# Patient Record
Sex: Male | Born: 1970 | Race: White | Hispanic: No | Marital: Married | Smoking: Never smoker
Health system: Southern US, Community
[De-identification: ages and names within clinical notes are randomized; demographics above are authoritative.]

## PROBLEM LIST (undated history)

## (undated) DIAGNOSIS — Z789 Other specified health status: Secondary | ICD-10-CM

## (undated) HISTORY — PX: NO PAST SURGERIES: SHX2092

---

## 2014-07-03 ENCOUNTER — Ambulatory Visit (HOSPITAL_COMMUNITY)
Admission: RE | Admit: 2014-07-03 | Discharge: 2014-07-03 | Disposition: A | Discharge status: Home / Self Care | Payer: BLUE CROSS/BLUE SHIELD | Source: Ambulatory Visit | Attending: Family Medicine | Admitting: Family Medicine

## 2014-07-03 ENCOUNTER — Other Ambulatory Visit: Payer: Self-pay | Admitting: Family Medicine

## 2014-07-03 ENCOUNTER — Ambulatory Visit (INDEPENDENT_AMBULATORY_CARE_PROVIDER_SITE_OTHER): Payer: BLUE CROSS/BLUE SHIELD | Admitting: Family Medicine

## 2014-07-03 VITALS — BP 124/76 | HR 64 | Temp 98.0°F | Resp 18 | Ht 75.0 in | Wt 246.0 lb

## 2014-07-03 DIAGNOSIS — R112 Nausea with vomiting, unspecified: Secondary | ICD-10-CM | POA: Insufficient documentation

## 2014-07-03 DIAGNOSIS — D72829 Elevated white blood cell count, unspecified: Secondary | ICD-10-CM

## 2014-07-03 DIAGNOSIS — I714 Abdominal aortic aneurysm, without rupture, unspecified: Secondary | ICD-10-CM

## 2014-07-03 DIAGNOSIS — K8020 Calculus of gallbladder without cholecystitis without obstruction: Secondary | ICD-10-CM | POA: Diagnosis not present

## 2014-07-03 DIAGNOSIS — R1013 Epigastric pain: Secondary | ICD-10-CM

## 2014-07-03 DIAGNOSIS — K8010 Calculus of gallbladder with chronic cholecystitis without obstruction: Secondary | ICD-10-CM | POA: Diagnosis not present

## 2014-07-03 LAB — COMPREHENSIVE METABOLIC PANEL
ALBUMIN: 4.4 g/dL (ref 3.5–5.2)
ALT: 26 U/L (ref 0–53)
AST: 19 U/L (ref 0–37)
Alkaline Phosphatase: 52 U/L (ref 39–117)
BUN: 13 mg/dL (ref 6–23)
CALCIUM: 9.4 mg/dL (ref 8.4–10.5)
CHLORIDE: 106 meq/L (ref 96–112)
CO2: 26 mEq/L (ref 19–32)
Creat: 0.7 mg/dL (ref 0.50–1.35)
Glucose, Bld: 145 mg/dL — ABNORMAL HIGH (ref 70–99)
POTASSIUM: 4.2 meq/L (ref 3.5–5.3)
Sodium: 143 mEq/L (ref 135–145)
Total Bilirubin: 0.5 mg/dL (ref 0.2–1.2)
Total Protein: 7 g/dL (ref 6.0–8.3)

## 2014-07-03 LAB — POCT CBC
Granulocyte percent: 90.3 %G — AB (ref 37–80)
HEMATOCRIT: 44.3 % (ref 43.5–53.7)
HEMOGLOBIN: 14.8 g/dL (ref 14.1–18.1)
LYMPH, POC: 0.7 (ref 0.6–3.4)
MCH, POC: 28.2 pg (ref 27–31.2)
MCHC: 33.3 g/dL (ref 31.8–35.4)
MCV: 84.6 fL (ref 80–97)
MID (cbc): 0.8 (ref 0–0.9)
MPV: 7.1 fL (ref 0–99.8)
POC Granulocyte: 13.7 — AB (ref 2–6.9)
POC LYMPH PERCENT: 4.5 %L — AB (ref 10–50)
POC MID %: 5.2 %M (ref 0–12)
Platelet Count, POC: 288 10*3/uL (ref 142–424)
RBC: 5.24 M/uL (ref 4.69–6.13)
RDW, POC: 12.9 %
WBC: 15.2 10*3/uL — AB (ref 4.6–10.2)

## 2014-07-03 LAB — AMYLASE: Amylase: 17 U/L (ref 0–105)

## 2014-07-03 LAB — LIPASE: Lipase: <10 U/L (ref 0–75)

## 2014-07-03 MED ORDER — GI COCKTAIL ~~LOC~~
30.0000 mL | Freq: Once | ORAL | Status: AC
  Administered 2014-07-03: 30 mL via ORAL

## 2014-07-03 MED ORDER — ONDANSETRON 4 MG PO TBDP
8.0000 mg | ORAL_TABLET | Freq: Once | ORAL | Status: AC
  Administered 2014-07-03: 8 mg via ORAL

## 2014-07-03 MED ORDER — ONDANSETRON 8 MG PO TBDP: 8.0000 mg | ORAL_TABLET | Freq: Three times a day (TID) | ORAL | Status: DC | PRN

## 2014-07-03 NOTE — Patient Instructions (Signed)
[facility] on the 1st Floor. **They are expecting your arrival.

## 2014-07-03 NOTE — Progress Notes (Signed)
I also examined this pt and noted epigastric TTP, no other belly tenderness.   He denies any CP with this illness or with exertion.  Agree with close follow-up with general surgery.   Will remind pt that he needs repeat US in 3 years- letter  IMPRESSION: 1. Cholelithiasis with minimal wall thickening. Absent sonographic Eulah PontMurphy sign argues against acute cholecystitis. Findings can be seen with chronic cholecystitis, however. 2. Small abdominal aortic aneurysm. Recommend followup by ultrasound in 3 years. This recommendation follows ACR consensus guidelines: White Paper of the ACR Incidental Findings Committee II on Vascular Findings. J Am Coll Radiol 2013; 10:789-794.

## 2014-07-03 NOTE — Progress Notes (Signed)
Subjective:    Patient ID: Matthew SayersJeffrey Goodman, male    DOB: 11/13/1970, 44 y.o.   MRN: [id number]  HPI This is a pleasant 44 yo male who does not have regular medical care. He presents today with constant upper abdominal ache. The pain awoke him from sleep at 3 am today. Can not get comfortable. Has vomited about 4 times. Vomitus stomach contacts and liquid. No blood. Had chills and felt hot with vomiting.  Has not taken any medication. Pain currently 9-10/10.  Had similar symptoms about 1-2 years ago x 2. These episodes resolved spontaneously in a short while. He thinks they were related to eating peanut butter which he had last night. He is concerned that this is from his gall bladder. He had one episode of brown diarrhea when he awoke this morning.    History reviewed. No pertinent past medical history. History reviewed. No pertinent past surgical history. History reviewed. No pertinent family history. History  Substance Use Topics  . Smoking status: Never Smoker   . Smokeless tobacco: Not on file  . Alcohol Use: 3.6 oz/week    6 Standard drinks or equivalent per week  Medications, allergies, past medical history, surgical history, family history, social history and problem list reviewed and updated.  Review of Systems No chest pain, no SOB, no fevers.     Objective:   Physical Exam  Constitutional: He is oriented to person, place, and time. He appears well-developed and well-nourished.  Appears moderately uncomfortable, moving and shifting in his seat.   HENT:  Head: Normocephalic and atraumatic.  Eyes: Conjunctivae are normal.  Neck: Normal range of motion. Neck supple.  Cardiovascular: Normal rate, regular rhythm and normal heart sounds.   Pulmonary/Chest: Effort normal and breath sounds normal.  Abdominal: Soft. He exhibits no distension. Bowel sounds are decreased. There is no hepatosplenomegaly. There is tenderness in the epigastric area. There is no rigidity, no rebound, no  guarding, no tenderness at McBurney's point and negative Murphy's sign. No hernia.  Musculoskeletal: Normal range of motion.  Neurological: He is alert and oriented to person, place, and time.  Skin: Skin is warm and dry.  Good skin turgor.    Psychiatric: He has a normal mood and affect. His behavior is normal. Judgment and thought content normal.  Vitals reviewed.  BP 124/76 mmHg  Pulse 64  Temp(Src) 98 F (36.7 C) (Oral)  Resp 18  Ht 6\' 3"  (1.905 m)  Wt 246 lb (111.585 kg)  BMI 30.75 kg/m2  SpO2 98% Given zofran 8 mg ODT with some relief of nausea  Given GI cocktail 30 ml at 12:05- no change in pain in 10 minutes.  Results for orders placed or performed in visit on 07/03/14  POCT CBC  Result Value Ref Range   WBC 15.2 (A) 4.6 - 10.2 K/uL   Lymph, poc 0.7 0.6 - 3.4   POC LYMPH PERCENT 4.5 (A) 10 - 50 %L   MID (cbc) 0.8 0 - 0.9   POC MID % 5.2 0 - 12 %M   POC Granulocyte 13.7 (A) 2 - 6.9   Granulocyte percent 90.3 (A) 37 - 80 %G   RBC 5.24 4.69 - 6.13 M/uL   Hemoglobin 14.8 14.1 - 18.1 g/dL   HCT, POC 40.944.3 81.143.5 - 53.7 %   MCV 84.6 80 - 97 fL   MCH, POC 28.2 27 - 31.2 pg   MCHC 33.3 31.8 - 35.4 g/dL   RDW, POC 91.412.9 %   Platelet  Count, POC 288 142 - 424 K/uL   MPV 7.1 0 - 99.8 fL   Abdominal Ultrasound IMPRESSION: 1. Cholelithiasis with minimal wall thickening. Absent sonographic Eulah PontMurphy sign argues against acute cholecystitis. Findings can be seen with chronic cholecystitis, however. 2. Small abdominal aortic aneurysm. Recommend followup by ultrasound in 3 years. This recommendation follows ACR consensus guidelines: White Paper of the ACR Incidental Findings Committee II on Vascular Findings. J Am Coll Radiol 2013; 10:789-794.   Electronically Signed  By: Leanna BattlesMelinda Blietz M.D.  On: 07/03/2014 14:34     Assessment & Plan:  1. Epigastric pain - POCT CBC - Comprehensive metabolic panel - gi cocktail (Maalox,Lidocaine,Donnatal); Take 30 mLs by mouth once. -  Amylase - Lipase - US Abdomen Complete; Future - Ambulatory referral to General Surgery  2. Non-intractable vomiting with nausea, vomiting of unspecified type - ondansetron (ZOFRAN-ODT) disintegrating tablet 8 mg; Take 2 tablets (8 mg total) by mouth once. - Amylase - Lipase - Ambulatory referral to General Surgery  3. Calculus of gallbladder with chronic cholecystitis without obstruction - Ambulatory referral to General Surgery  4. Leukocytosis - Ambulatory referral to General Surgery   Discussed with Dr. Patsy Lageropland who also examined the patient.  Called [facility] and was given an appointment for patient on 07/05/14 at 11:15. Patient notified and instructed to go to ER if worsening pain, fever over 101, inability to hold down liquids.    Olean Reeeborah Ebelin Dillehay, FNP-BC  [facility], [facility]  07/03/2014 3:37 PM

## 2014-07-05 ENCOUNTER — Other Ambulatory Visit: Payer: Self-pay | Admitting: Surgery

## 2014-08-05 NOTE — Patient Instructions (Addendum)
Matthew Goodman  08/05/2014   Your procedure is scheduled on:    08/12/2014    Report to [facility]  Entrance and follow signs to          3 EAST.  Take East elevators to 3 East at 1220pm  Call this number if you have problems the morning of surgery [phone]   Remember: ONLY 1 PERSON MAY GO WITH YOU TO SHORT STAY TO GET  READY MORNING OF YOUR SURGERY.  Do not eat foodafter midnite.  May have clear liquids until 0730am morning of surgery then nothing by mouth.     CLEAR LIQUID DIET   Foods Allowed                                                                     Foods Excluded  Coffee and tea, regular and decaf                             liquids that you cannot  Plain Jell-O in any flavor                                             see through such as: Fruit ices (not with fruit pulp)                                     milk, soups, orange juice  Iced Popsicles                                    All solid food Carbonated beverages, regular and diet                                    Cranberry, grape and apple juices Sports drinks like Gatorade Lightly seasoned clear broth or consume(fat free) Sugar, honey syrup  Sample Menu Breakfast                                Lunch                                     Supper Cranberry juice                    Beef broth                            Chicken broth Jell-O                                     Grape juice  Apple juice Coffee or tea                        Jell-O                                      Popsicle                                                Coffee or tea                        Coffee or tea  _____________________________________________________________________       Take these medicines the morning of surgery with A SIP OF WATER: none                                You may not have any metal on your body including hair pins and              piercings  Do not  wear jewelry,  lotions, powders or perfumes, deodorant                       Men may shave face and neck.   Do not bring valuables to the hospital. [facility] IS NOT             RESPONSIBLE   FOR VALUABLES.  Contacts, dentures or bridgework may not be worn into surgery.  Leave suitcase in the car. After surgery it may be brought to your room.       Special Instructions:coughing and deep breathing exercises, leg exercises               Please read over the following fact sheets you were given: _____________________________________________________________________             [facility] - Preparing for Surgery Before surgery, you can play an important role.  Because skin is not sterile, your skin needs to be as free of germs as possible.  You can reduce the number of germs on your skin by washing with CHG (chlorahexidine gluconate) soap before surgery.  CHG is an antiseptic cleaner which kills germs and bonds with the skin to continue killing germs even after washing. Please DO NOT use if you have an allergy to CHG or antibacterial soaps.  If your skin becomes reddened/irritated stop using the CHG and inform your nurse when you arrive at Short Stay. Do not shave (including legs and underarms) for at least 48 hours prior to the first CHG shower.  You may shave your face/neck. Please follow these instructions carefully:  1.  Shower with CHG Soap the night before surgery and the  morning of Surgery.  2.  If you choose to wash your hair, wash your hair first as usual with your  normal  shampoo.  3.  After you shampoo, rinse your hair and body thoroughly to remove the  shampoo.                           4.  Use CHG as you would any other liquid soap.  You can  apply chg directly  to the skin and wash                       Gently with a scrungie or clean washcloth.  5.  Apply the CHG Soap to your body ONLY FROM THE NECK DOWN.   Do not use on face/ open                           Wound or open  sores. Avoid contact with eyes, ears mouth and genitals (private parts).                       Wash face,  Genitals (private parts) with your normal soap.             6.  Wash thoroughly, paying special attention to the area where your surgery  will be performed.  7.  Thoroughly rinse your body with warm water from the neck down.  8.  DO NOT shower/wash with your normal soap after using and rinsing off  the CHG Soap.                9.  Pat yourself dry with a clean towel.            10.  Wear clean pajamas.            11.  Place clean sheets on your bed the night of your first shower and do not  sleep with pets. Day of Surgery : Do not apply any lotions/deodorants the morning of surgery.  Please wear clean clothes to the hospital/surgery center.  FAILURE TO FOLLOW THESE INSTRUCTIONS MAY RESULT IN THE CANCELLATION OF YOUR SURGERY PATIENT SIGNATURE_________________________________  NURSE SIGNATURE__________________________________  ________________________________________________________________________

## 2014-08-06 ENCOUNTER — Encounter (HOSPITAL_COMMUNITY): Payer: Self-pay

## 2014-08-06 ENCOUNTER — Encounter (HOSPITAL_COMMUNITY)
Admission: RE | Admit: 2014-08-06 | Discharge: 2014-08-06 | Disposition: A | Discharge status: Home / Self Care | Payer: BLUE CROSS/BLUE SHIELD | Source: Ambulatory Visit | Attending: Surgery | Admitting: Surgery

## 2014-08-06 DIAGNOSIS — K829 Disease of gallbladder, unspecified: Secondary | ICD-10-CM | POA: Insufficient documentation

## 2014-08-06 DIAGNOSIS — Z01812 Encounter for preprocedural laboratory examination: Secondary | ICD-10-CM | POA: Diagnosis present

## 2014-08-06 HISTORY — DX: Other specified health status: Z78.9

## 2014-08-06 LAB — CBC WITH DIFFERENTIAL/PLATELET
BASOS ABS: 0 10*3/uL (ref 0.0–0.1)
BASOS PCT: 0 % (ref 0–1)
Eosinophils Absolute: 0.4 10*3/uL (ref 0.0–0.7)
Eosinophils Relative: 5 % (ref 0–5)
HEMATOCRIT: 45.1 % (ref 39.0–52.0)
Hemoglobin: 14.9 g/dL (ref 13.0–17.0)
Lymphocytes Relative: 24 % (ref 12–46)
Lymphs Abs: 1.7 10*3/uL (ref 0.7–4.0)
MCH: 28.8 pg (ref 26.0–34.0)
MCHC: 33 g/dL (ref 30.0–36.0)
MCV: 87.1 fL (ref 78.0–100.0)
Monocytes Absolute: 0.5 10*3/uL (ref 0.1–1.0)
Monocytes Relative: 7 % (ref 3–12)
NEUTROS ABS: 4.3 10*3/uL (ref 1.7–7.7)
Neutrophils Relative %: 64 % (ref 43–77)
Platelets: 246 10*3/uL (ref 150–400)
RBC: 5.18 MIL/uL (ref 4.22–5.81)
RDW: 12.7 % (ref 11.5–15.5)
WBC: 6.9 10*3/uL (ref 4.0–10.5)

## 2014-08-06 LAB — COMPREHENSIVE METABOLIC PANEL
ALBUMIN: 4.2 g/dL (ref 3.5–5.0)
ALT: 28 U/L (ref 17–63)
AST: 19 U/L (ref 15–41)
Alkaline Phosphatase: 51 U/L (ref 38–126)
Anion gap: 8 (ref 5–15)
BILIRUBIN TOTAL: 0.9 mg/dL (ref 0.3–1.2)
BUN: 13 mg/dL (ref 6–20)
CALCIUM: 9.1 mg/dL (ref 8.9–10.3)
CO2: 29 mmol/L (ref 22–32)
CREATININE: 0.72 mg/dL (ref 0.61–1.24)
Chloride: 103 mmol/L (ref 101–111)
GFR calc Af Amer: >60 mL/min (ref >60)
GFR calc non Af Amer: >60 mL/min (ref >60)
Glucose, Bld: 91 mg/dL (ref 65–99)
Potassium: 4.1 mmol/L (ref 3.5–5.1)
SODIUM: 140 mmol/L (ref 135–145)
Total Protein: 6.9 g/dL (ref 6.5–8.1)

## 2014-08-12 ENCOUNTER — Ambulatory Visit (HOSPITAL_COMMUNITY): Payer: BLUE CROSS/BLUE SHIELD

## 2014-08-12 ENCOUNTER — Ambulatory Visit (HOSPITAL_COMMUNITY): Payer: BLUE CROSS/BLUE SHIELD | Admitting: Anesthesiology

## 2014-08-12 ENCOUNTER — Encounter (HOSPITAL_COMMUNITY): Payer: Self-pay | Admitting: *Deleted

## 2014-08-12 ENCOUNTER — Encounter (HOSPITAL_COMMUNITY)
Admission: RE | Disposition: A | Discharge status: Home / Self Care | Payer: Self-pay | Source: Ambulatory Visit | Attending: Surgery

## 2014-08-12 ENCOUNTER — Ambulatory Visit (HOSPITAL_COMMUNITY)
Admission: RE | Admit: 2014-08-12 | Discharge: 2014-08-12 | Disposition: A | Discharge status: Home / Self Care | Payer: BLUE CROSS/BLUE SHIELD | Source: Ambulatory Visit | Attending: Surgery | Admitting: Surgery

## 2014-08-12 DIAGNOSIS — I739 Peripheral vascular disease, unspecified: Secondary | ICD-10-CM | POA: Insufficient documentation

## 2014-08-12 DIAGNOSIS — I714 Abdominal aortic aneurysm, without rupture: Secondary | ICD-10-CM | POA: Diagnosis not present

## 2014-08-12 DIAGNOSIS — R1013 Epigastric pain: Secondary | ICD-10-CM | POA: Diagnosis present

## 2014-08-12 DIAGNOSIS — K8010 Calculus of gallbladder with chronic cholecystitis without obstruction: Secondary | ICD-10-CM | POA: Diagnosis not present

## 2014-08-12 DIAGNOSIS — Z419 Encounter for procedure for purposes other than remedying health state, unspecified: Secondary | ICD-10-CM

## 2014-08-12 HISTORY — PX: CHOLECYSTECTOMY: SHX55

## 2014-08-12 SURGERY — LAPAROSCOPIC CHOLECYSTECTOMY WITH INTRAOPERATIVE CHOLANGIOGRAM
Anesthesia: General | Site: Abdomen

## 2014-08-12 MED ORDER — FENTANYL CITRATE (PF) 250 MCG/5ML IJ SOLN
INTRAMUSCULAR | Status: AC
  Filled 2014-08-12: qty 5

## 2014-08-12 MED ORDER — FENTANYL CITRATE (PF) 100 MCG/2ML IJ SOLN
INTRAMUSCULAR | Status: DC | PRN
  Administered 2014-08-12 (×5): 50 ug via INTRAVENOUS

## 2014-08-12 MED ORDER — BUPIVACAINE HCL (PF) 0.25 % IJ SOLN
INTRAMUSCULAR | Status: AC
  Filled 2014-08-12: qty 30

## 2014-08-12 MED ORDER — CEFAZOLIN SODIUM-DEXTROSE 2-3 GM-% IV SOLR
INTRAVENOUS | Status: AC
  Filled 2014-08-12: qty 50

## 2014-08-12 MED ORDER — MIDAZOLAM HCL 5 MG/5ML IJ SOLN
INTRAMUSCULAR | Status: DC | PRN
  Administered 2014-08-12: 2 mg via INTRAVENOUS

## 2014-08-12 MED ORDER — HYDROCODONE-ACETAMINOPHEN 5-325 MG PO TABS: 1.0000 | ORAL_TABLET | Freq: Four times a day (QID) | ORAL | Status: AC | PRN

## 2014-08-12 MED ORDER — CHLORHEXIDINE GLUCONATE 4 % EX LIQD: 1.0000 "application " | Freq: Once | CUTANEOUS | Status: DC

## 2014-08-12 MED ORDER — HYDROMORPHONE HCL 1 MG/ML IJ SOLN
INTRAMUSCULAR | Status: AC
  Filled 2014-08-12: qty 1

## 2014-08-12 MED ORDER — ROCURONIUM BROMIDE 100 MG/10ML IV SOLN
INTRAVENOUS | Status: AC
  Filled 2014-08-12: qty 1

## 2014-08-12 MED ORDER — PROPOFOL 10 MG/ML IV BOLUS
INTRAVENOUS | Status: AC
  Filled 2014-08-12: qty 20

## 2014-08-12 MED ORDER — DEXAMETHASONE SODIUM PHOSPHATE 10 MG/ML IJ SOLN
INTRAMUSCULAR | Status: AC
  Filled 2014-08-12: qty 1

## 2014-08-12 MED ORDER — SODIUM CHLORIDE 0.9 % IJ SOLN
INTRAMUSCULAR | Status: AC
  Filled 2014-08-12: qty 10

## 2014-08-12 MED ORDER — GLYCOPYRROLATE 0.2 MG/ML IJ SOLN
INTRAMUSCULAR | Status: AC
  Filled 2014-08-12: qty 1

## 2014-08-12 MED ORDER — SCOPOLAMINE 1 MG/3DAYS TD PT72
MEDICATED_PATCH | TRANSDERMAL | Status: AC
  Filled 2014-08-12: qty 1

## 2014-08-12 MED ORDER — NEOSTIGMINE METHYLSULFATE 10 MG/10ML IV SOLN
INTRAVENOUS | Status: DC | PRN
  Administered 2014-08-12: 5 mg via INTRAVENOUS

## 2014-08-12 MED ORDER — LIDOCAINE HCL (CARDIAC) 20 MG/ML IV SOLN
INTRAVENOUS | Status: DC | PRN
  Administered 2014-08-12: 100 mg via INTRAVENOUS

## 2014-08-12 MED ORDER — IOHEXOL 300 MG/ML  SOLN
INTRAMUSCULAR | Status: DC | PRN
  Administered 2014-08-12: 5 mL

## 2014-08-12 MED ORDER — HYDROMORPHONE HCL 1 MG/ML IJ SOLN
0.2500 mg | INTRAMUSCULAR | Status: DC | PRN
  Administered 2014-08-12 (×2): 0.5 mg via INTRAVENOUS

## 2014-08-12 MED ORDER — LIDOCAINE HCL (CARDIAC) 20 MG/ML IV SOLN
INTRAVENOUS | Status: AC
  Filled 2014-08-12: qty 5

## 2014-08-12 MED ORDER — LACTATED RINGERS IV SOLN
INTRAVENOUS | Status: DC
  Administered 2014-08-12: 1000 mL via INTRAVENOUS

## 2014-08-12 MED ORDER — ONDANSETRON HCL 4 MG/2ML IJ SOLN
INTRAMUSCULAR | Status: DC | PRN
  Administered 2014-08-12: 4 mg via INTRAVENOUS

## 2014-08-12 MED ORDER — 0.9 % SODIUM CHLORIDE (POUR BTL) OPTIME
TOPICAL | Status: DC | PRN
  Administered 2014-08-12: 1000 mL

## 2014-08-12 MED ORDER — ONDANSETRON HCL 4 MG/2ML IJ SOLN
INTRAMUSCULAR | Status: AC
  Filled 2014-08-12: qty 2

## 2014-08-12 MED ORDER — GLYCOPYRROLATE 0.2 MG/ML IJ SOLN
INTRAMUSCULAR | Status: DC | PRN
  Administered 2014-08-12: 0.6 mg via INTRAVENOUS

## 2014-08-12 MED ORDER — EPHEDRINE SULFATE 50 MG/ML IJ SOLN
INTRAMUSCULAR | Status: AC
  Filled 2014-08-12: qty 1

## 2014-08-12 MED ORDER — CEFAZOLIN SODIUM-DEXTROSE 2-3 GM-% IV SOLR
2.0000 g | INTRAVENOUS | Status: AC
  Administered 2014-08-12: 2 g via INTRAVENOUS

## 2014-08-12 MED ORDER — EPHEDRINE SULFATE 50 MG/ML IJ SOLN
INTRAMUSCULAR | Status: DC | PRN
  Administered 2014-08-12 (×2): 10 mg via INTRAVENOUS

## 2014-08-12 MED ORDER — PROMETHAZINE HCL 25 MG/ML IJ SOLN: 6.2500 mg | INTRAMUSCULAR | Status: DC | PRN

## 2014-08-12 MED ORDER — HYDROCODONE-ACETAMINOPHEN 5-325 MG PO TABS: 1.0000 | ORAL_TABLET | Freq: Four times a day (QID) | ORAL | Status: DC | PRN

## 2014-08-12 MED ORDER — MIDAZOLAM HCL 2 MG/2ML IJ SOLN
INTRAMUSCULAR | Status: AC
  Filled 2014-08-12: qty 2

## 2014-08-12 MED ORDER — DEXAMETHASONE SODIUM PHOSPHATE 10 MG/ML IJ SOLN
INTRAMUSCULAR | Status: DC | PRN
Start: 2014-08-12 — End: 2014-08-12
  Administered 2014-08-12: 10 mg via INTRAVENOUS

## 2014-08-12 MED ORDER — BUPIVACAINE HCL (PF) 0.25 % IJ SOLN
INTRAMUSCULAR | Status: DC | PRN
Start: 2014-08-12 — End: 2014-08-12
  Administered 2014-08-12: 30 mL

## 2014-08-12 MED ORDER — SCOPOLAMINE 1 MG/3DAYS TD PT72
1.0000 | MEDICATED_PATCH | TRANSDERMAL | Status: DC
  Administered 2014-08-12: 1.5 mg via TRANSDERMAL

## 2014-08-12 MED ORDER — GLYCOPYRROLATE 0.2 MG/ML IJ SOLN
INTRAMUSCULAR | Status: AC
  Filled 2014-08-12: qty 3

## 2014-08-12 MED ORDER — ROCURONIUM BROMIDE 100 MG/10ML IV SOLN
INTRAVENOUS | Status: DC | PRN
  Administered 2014-08-12: 35 mg via INTRAVENOUS
  Administered 2014-08-12 (×2): 5 mg via INTRAVENOUS
  Administered 2014-08-12: 10 mg via INTRAVENOUS

## 2014-08-12 MED ORDER — LACTATED RINGERS IR SOLN
Status: DC | PRN
  Administered 2014-08-12: 1000 mL

## 2014-08-12 MED ORDER — SUCCINYLCHOLINE CHLORIDE 20 MG/ML IJ SOLN
INTRAMUSCULAR | Status: DC | PRN
  Administered 2014-08-12: 100 mg via INTRAVENOUS

## 2014-08-12 MED ORDER — PROPOFOL 10 MG/ML IV BOLUS
INTRAVENOUS | Status: DC | PRN
  Administered 2014-08-12: 40 mg via INTRAVENOUS
  Administered 2014-08-12: 200 mg via INTRAVENOUS

## 2014-08-12 SURGICAL SUPPLY — 35 items
APPLIER CLIP 5 13 M/L LIGAMAX5 (MISCELLANEOUS) ×3
APPLIER CLIP ROT 10 11.4 M/L (STAPLE) ×3
BENZOIN TINCTURE PRP APPL 2/3 (GAUZE/BANDAGES/DRESSINGS) IMPLANT
CHLORAPREP W/TINT 26ML (MISCELLANEOUS) ×3 IMPLANT
CHOLANGIOGRAM CATH TAUT (CATHETERS) ×3 IMPLANT
CLIP APPLIE 5 13 M/L LIGAMAX5 (MISCELLANEOUS) ×1 IMPLANT
CLIP APPLIE ROT 10 11.4 M/L (STAPLE) ×1 IMPLANT
CLOSURE WOUND 1/4X4 (GAUZE/BANDAGES/DRESSINGS)
COVER MAYO STAND STRL (DRAPES) ×3 IMPLANT
DECANTER SPIKE VIAL GLASS SM (MISCELLANEOUS) IMPLANT
DRAPE C-ARM 42X120 X-RAY (DRAPES) IMPLANT
DRAPE LAPAROSCOPIC ABDOMINAL (DRAPES) ×3 IMPLANT
ELECT REM PT RETURN 9FT ADLT (ELECTROSURGICAL) ×3
ELECTRODE REM PT RTRN 9FT ADLT (ELECTROSURGICAL) ×1 IMPLANT
GLOVE SURG SIGNA 7.5 PF LTX (GLOVE) ×3 IMPLANT
GOWN STRL REUS W/TWL XL LVL3 (GOWN DISPOSABLE) ×9 IMPLANT
HEMOSTAT SURGICEL 4X8 (HEMOSTASIS) IMPLANT
IV CATH 14GX2 1/4 (CATHETERS) ×3 IMPLANT
IV SET EXTENSION CATH 6 NF (IV SETS) ×3 IMPLANT
KIT BASIN OR (CUSTOM PROCEDURE TRAY) ×3 IMPLANT
LIQUID BAND (GAUZE/BANDAGES/DRESSINGS) ×3 IMPLANT
POUCH RETRIEVAL ECOSAC 10 (ENDOMECHANICALS) ×1 IMPLANT
POUCH RETRIEVAL ECOSAC 10MM (ENDOMECHANICALS) ×2
POUCH SPECIMEN RETRIEVAL 10MM (ENDOMECHANICALS) IMPLANT
SET IRRIG TUBING LAPAROSCOPIC (IRRIGATION / IRRIGATOR) ×3 IMPLANT
SLEEVE XCEL OPT CAN 5 100 (ENDOMECHANICALS) ×6 IMPLANT
STOPCOCK 4 WAY LG BORE MALE ST (IV SETS) ×3 IMPLANT
STRIP CLOSURE SKIN 1/4X4 (GAUZE/BANDAGES/DRESSINGS) IMPLANT
SUT MNCRL AB 4-0 PS2 18 (SUTURE) ×3 IMPLANT
SUT VIC AB 5-0 PS2 18 (SUTURE) IMPLANT
TOWEL OR 17X26 10 PK STRL BLUE (TOWEL DISPOSABLE) ×3 IMPLANT
TRAY LAPAROSCOPIC (CUSTOM PROCEDURE TRAY) ×3 IMPLANT
TROCAR BLADELESS OPT 5 100 (ENDOMECHANICALS) ×3 IMPLANT
TROCAR XCEL BLUNT TIP 100MML (ENDOMECHANICALS) ×3 IMPLANT
TROCAR XCEL NON-BLD 11X100MML (ENDOMECHANICALS) IMPLANT

## 2014-08-12 NOTE — Anesthesia Postprocedure Evaluation (Signed)
Anesthesia Post Note  Patient: Matthew Goodman  Procedure(s) Performed: Procedure(s) (LRB): LAPAROSCOPIC CHOLECYSTECTOMY WITH INTRAOPERATIVE CHOLANGIOGRAM (N/A)  Anesthesia type: general  Patient location: PACU  Post pain: Pain level controlled  Post assessment: Patient's Cardiovascular Status Stable  Last Vitals:  Filed Vitals:   08/12/14 1646  BP: 116/69  Pulse: 65  Temp: 36.9 C  Resp: 16    Post vital signs: Reviewed and stable  Level of consciousness: sedated  Complications: No apparent anesthesia complications

## 2014-08-12 NOTE — Progress Notes (Addendum)
Patient has ambulated the entire hallway of 3E after laparoscopic cholecystectomy. Tolerated well. Pain level is a 2 and no nausea.  [id number]   Patient has met the criteria for discharge. Waiting for wife to return after picking up children.

## 2014-08-12 NOTE — Discharge Instructions (Signed)
[facility] - DISCHARGE INSTRUCTIONS TO PATIENT  Activity:  Driving - May drive in 2 to 4 days, if doing well.   Lifting - No lifting more than 15 pounds for 10 days, then no limit  Wound Care:   May shower started Wednesday, 6/29. You have skin glue. No dressing changes..   Diet:  As tolerated.  Follow up appointment:  Call Dr. Allene PyoNewman's office [facility] at [phone] for an appointment in 2 to 3 weeks  Medications and dosages:  Resume your home medications.  You have a prescription for:  Vicodin  Call Dr. Ezzard StandingNewman or his office  [phone]) if you have:  Temperature greater than 100.4,  Persistent nausea and vomiting,  Severe uncontrolled pain,  Redness, tenderness, or signs of infection (pain, swelling, redness, odor or green/yellow discharge around the site),  Difficulty breathing, headache or visual disturbances,  Any other questions or concerns you may have after discharge.  In an emergency, call 911 or go to an Emergency Department at a nearby hospital.      General Anesthesia, Care After Refer to this sheet in the next few weeks. These instructions provide you with information on caring for yourself after your procedure. Your health care provider may also give you more specific instructions. Your treatment has been planned according to current medical practices, but problems sometimes occur. Call your health care provider if you have any problems or questions after your procedure. WHAT TO EXPECT AFTER THE PROCEDURE After the procedure, it is typical to experience:  Sleepiness.  Nausea and vomiting. HOME CARE INSTRUCTIONS  For the first 24 hours after general anesthesia:  Have a responsible person with you.  Do not drive a car. If you are alone, do not take public transportation.  Do not drink alcohol.  Do not take medicine that has not been prescribed by your health care provider.  Do not sign important papers or make important  decisions.  You may resume a normal diet and activities as directed by your health care provider.  Change bandages (dressings) as directed.  If you have questions or problems that seem related to general anesthesia, call the hospital and ask for the anesthetist or anesthesiologist on call. SEEK MEDICAL CARE IF:  You have nausea and vomiting that continue the day after anesthesia.  You develop a rash. SEEK IMMEDIATE MEDICAL CARE IF:   You have difficulty breathing.  You have chest pain.  You have any allergic problems. Document Released: 05/10/2000 Document Revised: 02/06/2013 Document Reviewed: 08/17/2012 [facility] Patient Information 2015 [employer] [place] [place]. This information is not intended to replace advice given to you by your health care provider. Make sure you discuss any questions you have with your health care provider.    Remove your scopalamine patch behind your ear on 08/13/14 about 1 PM. Wash your hands after removing patch taking care not to get ointment on your fingers. Wash skin behind your ear to remove any remaining ointment.

## 2014-08-12 NOTE — H&P (Signed)
[facility] 07/05/2014 10:53 AM Location: [facility] Patient #: [id number] DOB: [birth year] Married / Language: English / Race: White Male  History of Present Illness   Patient words: gallstones.   The patient is a 44 year old male who presents for evaluation of gall stones. He does not have a PCP. he knows that he needs to get one. He saw Olean Ree, FNP at the [facility]. He comes by himself.   He has had an attack of vague epigastric pain about once a year for several years. The other day he had some peanut butter and developed significant epigastric pain. He went to the [facility] urgent medical care facility. He had nausea and vomiting with the pain. He had no fever. No history of jaundice.  The patient had an ultrasound of his abdomen on 03 Jul 2014. This showed stones measuring up to 1.4 cm in his gallbladder. His gallbladder wall measured 4 mm. He also had a small abdominal aortic aneurysm measuring 3.4 cm.  His liver enzymes were normal.  He has no history of stomach, liver, or colon disease. He has had no prior abdominal surgery. He has no family history of gallbladder disease, though he thinks his mother may have had her gallbladder removed. His wife has had gallbladder surgery. His wife's name is Cala Bradford, but I could not find her in Epic.   I discussed with the patient the indications and risks of gall bladder surgery. The primary risks of gall bladder surgery include, but are not limited to, bleeding, infection, common bile duct injury, and open surgery. There is also the risk that the patient may have continued symptoms after surgery. However, the likelihood of improvement in symptoms and return to the patient's normal status is good. We discussed the typical post-operative recovery course. I tried to answer the patient's questions. I gave the patient literature about gall bladder surgery.   Past medical History: 1. 3.4 cm abdominal  aortic aneurysm seen on ultrasound. There are recommendations repeat his ultrasound in 3 years. I gave him a copy of report and stressed the importance of follow-up.  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 He is able to work from home and he appoligized for his clothing. Has 2 children: 6 and 9   Other Problems Gilmer Mor, CMA; 07/05/2014 10:53 AM) Cholelithiasis  Past Surgical History Gilmer Mor, CMA; 07/05/2014 10:53 AM) No pertinent past surgical history  Diagnostic Studies History Gilmer Mor, CMA; 07/05/2014 10:53 AM) Colonoscopy never  Allergies (Sonya Bynum, CMA; 07/05/2014 10:54 AM) No Known Drug Allergies05/20/2016  Medication History Lamar Laundry Bynum, CMA; 07/05/2014 10:54 AM) No Current Medications Medications Reconciled  Social History Gilmer Mor, CMA; 07/05/2014 10:53 AM) Alcohol use Moderate alcohol use. Caffeine use Coffee. No drug use Tobacco use Never smoker.  Family History Gilmer Mor, CMA; 07/05/2014 10:53 AM) First Degree Relatives No pertinent family history  Review of Systems Lamar Laundry Bynum CMA; 07/05/2014 10:53 AM) General Not Present- Appetite Loss, Chills, Fatigue, Fever, Night Sweats, Weight Gain and Weight Loss. Skin Not Present- Change in Wart/Mole, Dryness, Hives, Jaundice, New Lesions, Non-Healing Wounds, Rash and Ulcer. HEENT Present- Wears glasses/contact lenses. Not Present- Earache, Hearing Loss, Hoarseness, Nose Bleed, Oral Ulcers, Ringing in the Ears, Seasonal Allergies, Sinus Pain, Sore Throat, Visual Disturbances and Yellow Eyes. Respiratory Not Present- Bloody sputum, Chronic Cough, Difficulty Breathing, Snoring and Wheezing. Breast Not Present- Breast Mass, Breast Pain, Nipple Discharge and Skin Changes. Cardiovascular Not Present- Chest Pain, Difficulty  Breathing Lying Down, Leg Cramps, Palpitations, Rapid Heart Rate, Shortness of Breath and Swelling of Extremities. Gastrointestinal Not  Present- Abdominal Pain, Bloating, Bloody Stool, Change in Bowel Habits, Chronic diarrhea, Constipation, Difficulty Swallowing, Excessive gas, Gets full quickly at meals, Hemorrhoids, Indigestion, Nausea, Rectal Pain and Vomiting. Male Genitourinary Not Present- Blood in Urine, Change in Urinary Stream, Frequency, Impotence, Nocturia, Painful Urination, Urgency and Urine Leakage. Musculoskeletal Not Present- Back Pain, Joint Pain, Joint Stiffness, Muscle Pain, Muscle Weakness and Swelling of Extremities. Neurological Not Present- Decreased Memory, Fainting, Headaches, Numbness, Seizures, Tingling, Tremor, Trouble walking and Weakness. Psychiatric Not Present- Anxiety, Bipolar, Change in Sleep Pattern, Depression, Fearful and Frequent crying. Endocrine Not Present- Cold Intolerance, Excessive Hunger, Hair Changes, Heat Intolerance, Hot flashes and New Diabetes. Hematology Not Present- Easy Bruising, Excessive bleeding, Gland problems, HIV and Persistent Infections.   Vitals (Sonya Bynum CMA; 07/05/2014 10:54 AM) 07/05/2014 10:53 AM Weight: 243.2 lb Height: 74in Body Surface Area: 2.4 m Body Mass Index: 31.22 kg/m Temp.: 1F(Temporal)  Pulse: 77 (Regular)  BP: 120/78 (Sitting, Left Arm, Standard)  Physical Exam: The physical exam findings are as follows: Note:General: WN tall WM alert and generally healthy appearing. HEENT: Normal. Pupils equal.  Neck: Supple. No mass. No thyroid mass. Lymph Nodes: No supraclavicular or cervical nodes.  Lungs: Clear to auscultation and symmetric breath sounds. Heart: RRR. No murmur or rub.  Abdomen: Soft. No mass. No tenderness. No hernia. Normal bowel sounds. No abdominal scars. When he has tenderness, he points to his epigastrium. The pain does not lateralize. Rectal: Not done.  Extremities: Good strength and ROM in upper and lower extremities.  Neurologic: Grossly intact to motor and sensory function. Psychiatric: Has normal mood and  affect. Behavior is normal.   Assessment & Plan: 1.  GALL BLADDER DISEASE (575.9  K82.9)  Current Plans - Schedule for Surgery  2.  ABDOMINAL AORTIC ANEURYSM WITHOUT RUPTURE (441.4  I71.4)  Impression: US showed 3.4 cm aorta - recommend repeat exam in 3 years.  I gave him a copy of the US report.   Ovidio Kinavid Blaike Vickers, MD, [facility] Pager: [phone] Office phone:  [phone]

## 2014-08-12 NOTE — Op Note (Signed)
08/12/2014  3:40 PM  PATIENT:  Matthew Goodman, 44 y.o., male, MRN: [id number]  PREOP DIAGNOSIS:  gall bladder disease  POSTOP DIAGNOSIS:   Chronic cholecystitis, cholelithiasis  PROCEDURE:   Procedure(s): LAPAROSCOPIC CHOLECYSTECTOMY WITH INTRAOPERATIVE CHOLANGIOGRAM  SURGEON:   Ovidio Kin, M.D.  Threasa HeadsSheron Nightingale, M.D.  ANESTHESIA:   general  Anesthesiologist: Heather Roberts, MD CRNA: Orest Dikes, CRNA  General  ASA: 2  EBL:  minimal  ml  BLOOD ADMINISTERED: none  DRAINS: none   LOCAL MEDICATIONS USED:   30 cc 1/4% marcaine.  SPECIMEN:   Gall bladder  COUNTS CORRECT:  YES  INDICATIONS FOR PROCEDURE:  Larnell Granlund is a 44 y.o. (DOB: 07-26-1970) white  male whose primary care physician is No PCP Per Patient and comes for cholecystectomy.   The indications and risks of the gall bladder surgery were explained to the patient.  The risks include, but are not limited to, infection, bleeding, common bile duct injury and open surgery.  SURGERY:  The patient was taken to room #1 at [facility].  The abdomen was prepped with chloroprep.  The patient was given 2 gm Ancef at the beginning of the operation.   A time out was held and the surgical checklist run.   An infraumbilical incision was made into the abdominal cavity.  A 12 mm Hasson trocar was inserted into the abdominal cavity through the infraumbilical incision and secured with a 0 Vicryl suture.  Four additional trocars were inserted: a 5 mm trocar in the sub-xiphoid location, a 5 mm trocar in the right mid subcostal area, a 5 mm trocar in the right lateral subcostal area, and a 5 mm between the xiphoid and umbilicus.   The abdomen was explored and the liver, stomach, and bowel that could be seen were unremarkable.   The gall bladder was full of large stones.  He had evidence of chronic cholecystitis.  The distal gall bladder was stuck to the lateral duodenal wall.  I dissected the gall bladder off the  duodenum.  I grasped the gall bladder and rotated it cephalad.  Disssection was carried down to the gall bladder/cystic duct junction and the cystic duct isolated.  Once I dissected out the gall bladder neck, the gall bladder anatomy looked more obvious.  A clip was placed on the gall bladder side of the cystic duct.   An intra-operative cholangiogram was shot.   The intra-operative cholangiogram was shot using a cut off Taut catheter placed through a 14 gauge angiocath in the RUQ.  The Taut catheter was inserted in the cut cystic duct and secured with an endoclip.  A cholangiogram was shot with 10 cc of 1/2 strength Omnipaque.  Using fluoroscopy, the cholangiogram showed the flow of contrast into the common bile duct, up the hepatic radicals, and into the duodenum.  There was no mass or obstruction.  This was a normal intra-operative cholangiogram.   The Taut catheter was removed.  The cystic duct was tripley endoclipped and the cystic artery was identified and clipped.  The gall bladder was bluntly and sharpley dissected from the gall bladder bed.   After the gall bladder was removed from the liver, the gall bladder bed and [place] were inspected.  There was no bleeding or bile leak.  The gall bladder was placed in a endocatch bag and delivered through the umbilicus.  The abdomen was irrigated with 1,800 cc saline.   The trocars were then removed.  I infiltrated  30cc of 1/4% Marcaine into the incisions.  The umbilical port closed with a 0 Vicryl suture and the skin closed with 5-0 Monocryl.  The skin was painted with LIquiBand.  The patient's sponge and needle count were correct.  The patient was transported to the RR in good condition.   I will plan to let him go home if he does well.  Ovidio Kinavid Shivan Hodes, MD, [facility] Pager: [phone] Office phone:  [phone]

## 2014-08-12 NOTE — Transfer of Care (Signed)
Immediate Anesthesia Transfer of Care Note  Patient: Matthew Goodman  Procedure(s) Performed: Procedure(s): LAPAROSCOPIC CHOLECYSTECTOMY WITH INTRAOPERATIVE CHOLANGIOGRAM (N/A)  Patient Location: PACU  Anesthesia Type:General  Level of Consciousness:  sedated, patient cooperative and responds to stimulation  Airway & Oxygen Therapy:Patient Spontanous Breathing and Patient connected to face mask oxgen  Post-op Assessment:  Report given to PACU RN and Post -op Vital signs reviewed and stable  Post vital signs:  Reviewed and stable  Last Vitals:  Filed Vitals:   08/12/14 1226  BP: 136/95  Pulse: 68  Temp: 36.7 C  Resp: 18    Complications: No apparent anesthesia complications

## 2014-08-12 NOTE — Anesthesia Procedure Notes (Signed)
Procedure Name: Intubation Date/Time: 08/12/2014 1:59 PM Performed by: Orest DikesPETERS, Sabreena Vogan J Pre-anesthesia Checklist: Patient identified, Emergency Drugs available, Suction available and Patient being monitored Patient Re-evaluated:Patient Re-evaluated prior to inductionOxygen Delivery Method: Circle System Utilized Preoxygenation: Pre-oxygenation with 100% oxygen Intubation Type: IV induction Ventilation: Mask ventilation without difficulty Laryngoscope Size: Mac and 4 Grade View: Grade I Tube type: Oral Tube size: 7.5 mm Number of attempts: 1 Airway Equipment and Method: Stylet and Oral airway Placement Confirmation: ETT inserted through vocal cords under direct vision,  positive ETCO2 and breath sounds checked- equal and bilateral Secured at: 21 cm Tube secured with: Tape Dental Injury: Teeth and Oropharynx as per pre-operative assessment

## 2014-08-12 NOTE — Anesthesia Preprocedure Evaluation (Addendum)
Anesthesia Evaluation  Patient identified by MRN, date of birth, ID band Patient awake    Reviewed: Allergy & Precautions, NPO status , Patient's Chart, lab work & pertinent test results  History of Anesthesia Complications (+) history of anesthetic complications  Airway Mallampati: II  TM Distance: >3 FB Neck ROM: Full    Dental  (+) Teeth Intact, Dental Advisory Given   Pulmonary neg pulmonary ROS,    Pulmonary exam normal       Cardiovascular + Peripheral Vascular Disease Normal cardiovascular exam    Neuro/Psych negative neurological ROS  negative psych ROS   GI/Hepatic Neg liver ROS,   Endo/Other  negative endocrine ROS  Renal/GU negative Renal ROS     Musculoskeletal   Abdominal   Peds  Hematology   Anesthesia Other Findings   Reproductive/Obstetrics                            Anesthesia Physical Anesthesia Plan  ASA: II  Anesthesia Plan: General   Post-op Pain Management:    Induction: Intravenous  Airway Management Planned: Oral ETT  Additional Equipment:   Intra-op Plan:   Post-operative Plan: Extubation in OR  Informed Consent: I have reviewed the patients History and Physical, chart, labs and discussed the procedure including the risks, benefits and alternatives for the proposed anesthesia with the patient or authorized representative who has indicated his/her understanding and acceptance.   Dental advisory given  Plan Discussed with: CRNA, Anesthesiologist and Surgeon  Anesthesia Plan Comments:        Anesthesia Quick Evaluation

## 2014-08-13 ENCOUNTER — Encounter (HOSPITAL_COMMUNITY): Payer: Self-pay | Admitting: Surgery

## 2015-12-26 IMAGING — US US ABDOMEN COMPLETE
1 series · 13 of 25 positions shown · non-contrast
Comparison: None.

CLINICAL DATA: Acute epigastric pain with nausea and vomiting.

EXAM:
ULTRASOUND ABDOMEN COMPLETE

[Series 1: us abdomen complete · 0.20mm/px · 13 of 98 slices shown]
[im 1/98]
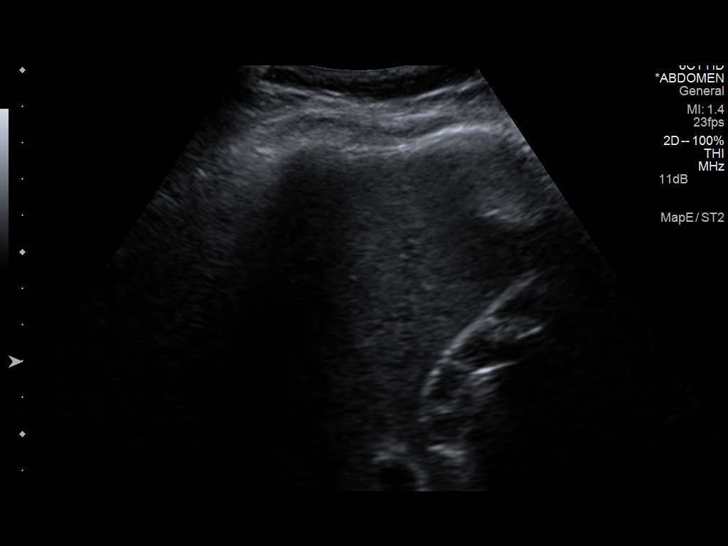
[im 9/98]
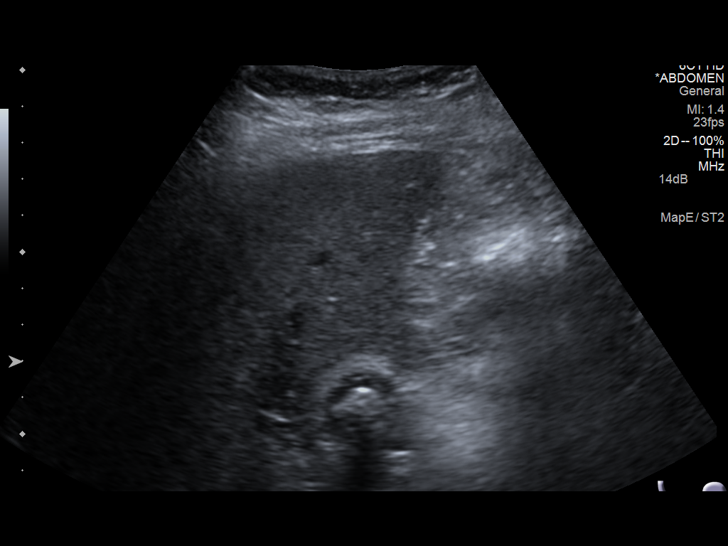
[im 17/98]
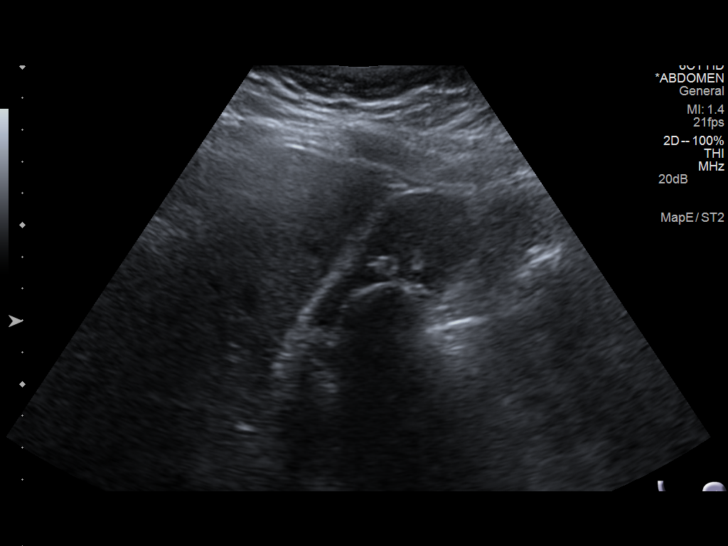
[im 25/98]
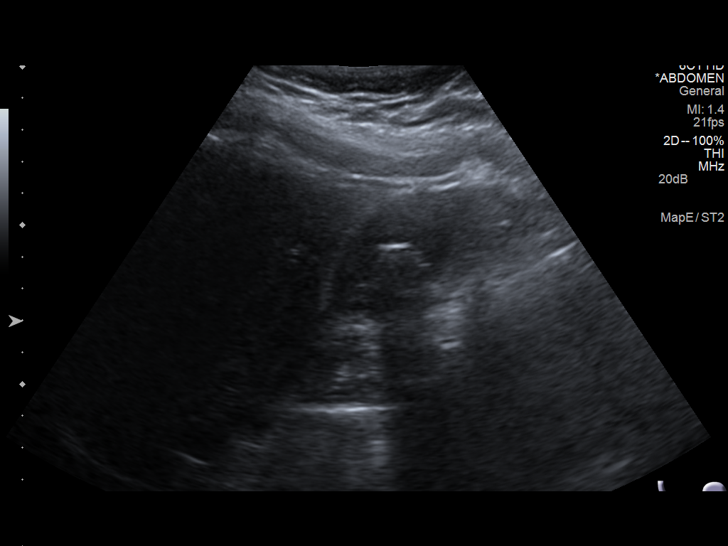
[im 33/98]
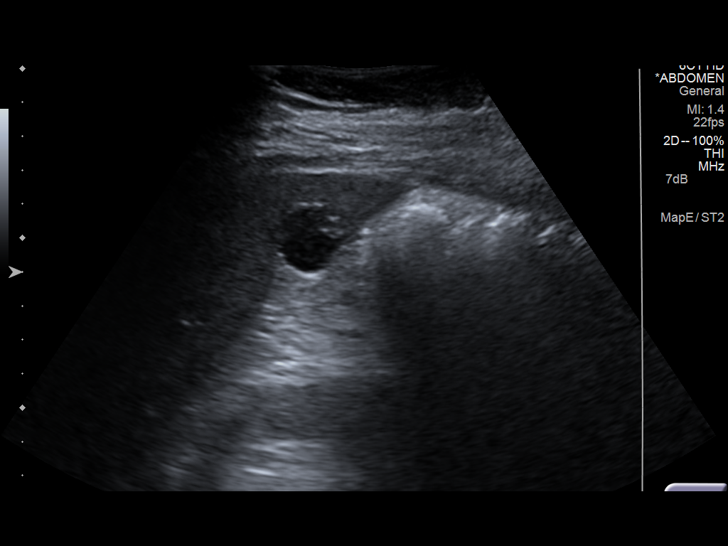
[im 41/98]
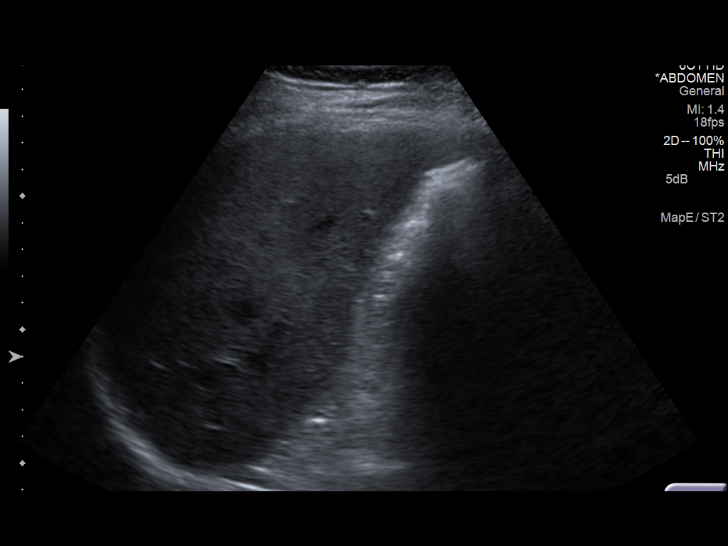
[im 49/98]
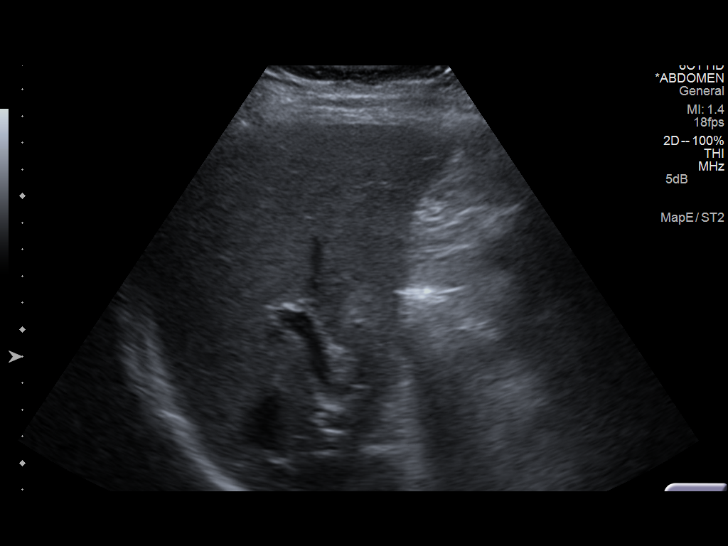
[im 57/98]
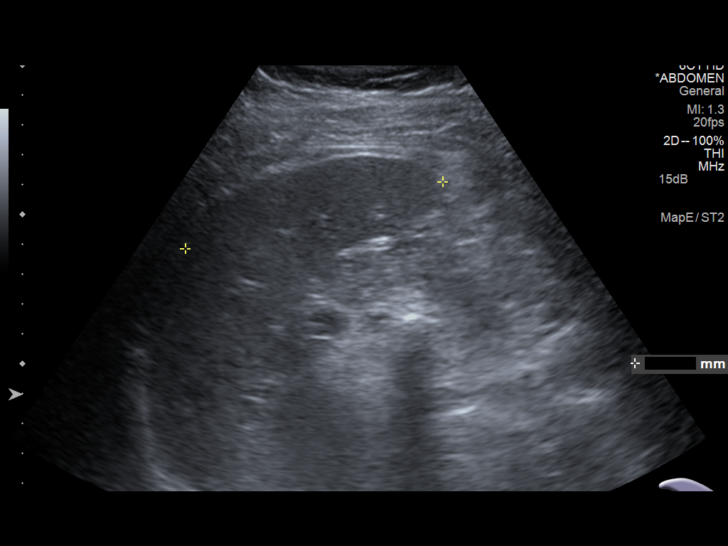
[im 65/98]
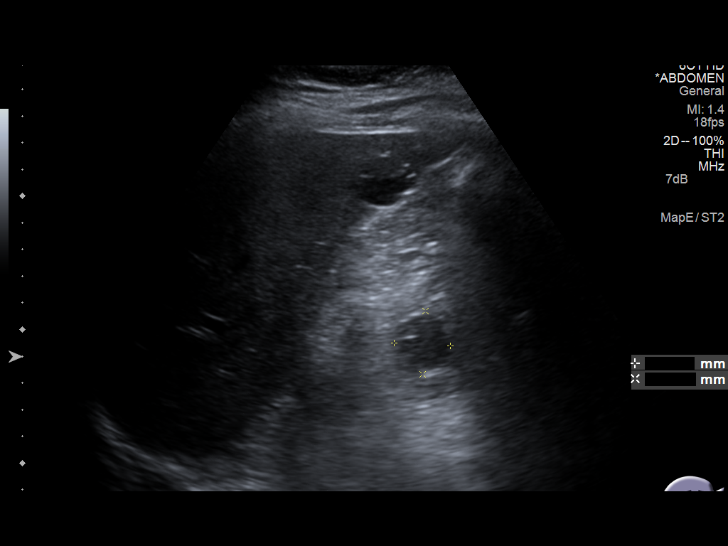
[im 73/98]
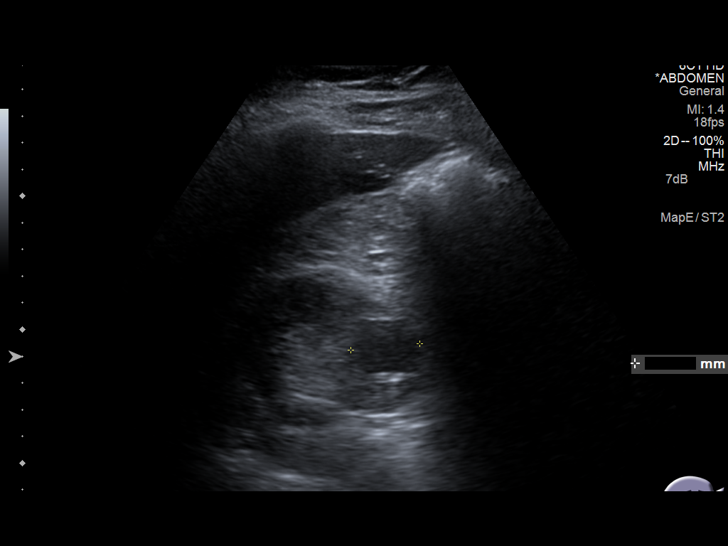
[im 81/98]
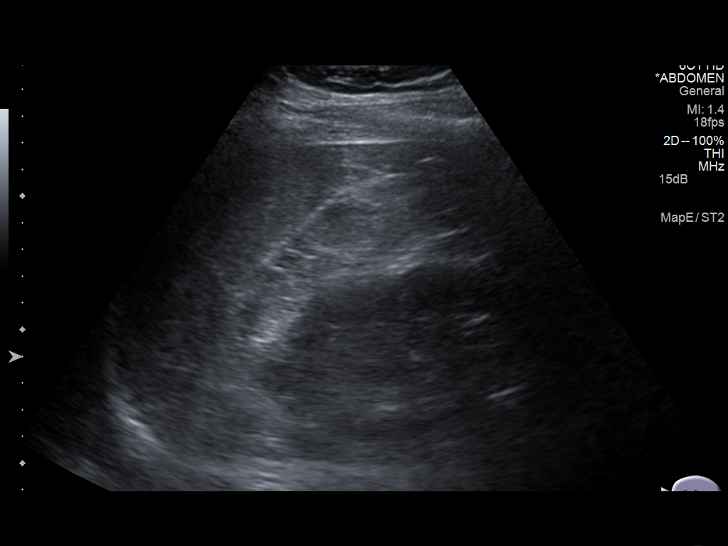
[im 89/98]
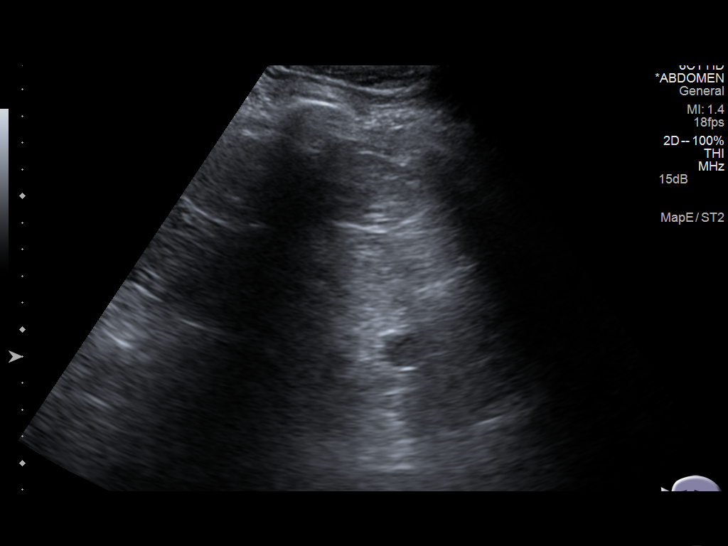
[im 98/98]
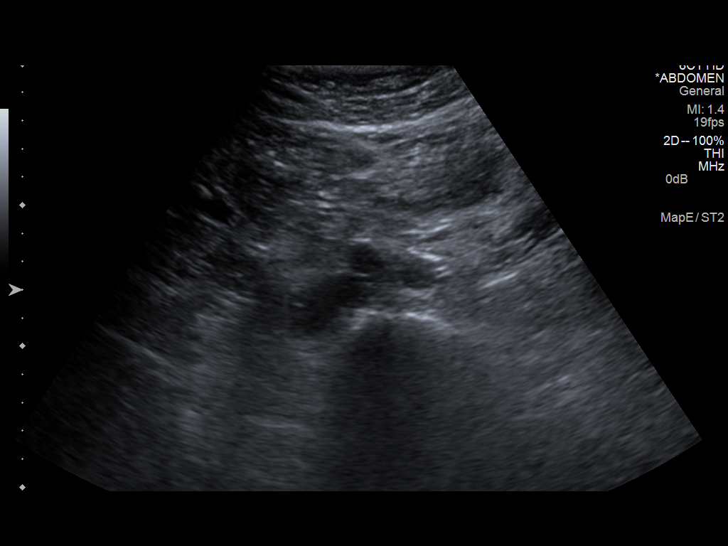

[13 of 25 positions shown; findings below may reference images not displayed]

FINDINGS: Gallbladder: Shadowing echogenic stones measure up to 1.4 cm.
Gallbladder wall measures 4 mm. Negative sonographic Murphy sign.

Common bile duct: Diameter: 5 mm, within normal limits.

Liver: A 2.3 cm anechoic lesion with increased through transmission
is consistent with a cyst. Parenchymal echogenicity is otherwise
within normal limits.

IVC: No abnormality visualized.

Pancreas: Visualized portion unremarkable.

Spleen: 8.9 cm, negative.

Right Kidney: Length: 11.8 cm. A 2.1 x 2.4 x 2.6 cm anechoic lesion
with increased through transmission is consistent with a cyst.
Parenchymal echogenicity is within normal limits. No hydronephrosis.

Left Kidney: Length: 13.1 cm. Parenchymal echogenicity is within
normal limits. A 1.6 x 1.4 x 1.4 cm anechoic lesion with increased
through transmission is consistent with a cyst. No hydronephrosis.

Abdominal aorta: Measures up to 3.4 cm and in the midportion.

Other findings: None.
IMPRESSION: 1. Cholelithiasis with minimal wall thickening. Absent sonographic
Murphy sign argues against acute cholecystitis. Findings can be seen
with chronic cholecystitis, however.
2. Small abdominal aortic aneurysm. Recommend followup by ultrasound
in 3 years. This recommendation follows ACR consensus guidelines:
White Paper of the ACR Incidental Findings Committee II on Vascular
Findings. [HOSPITAL] 8098; [DATE].

## 2016-02-04 IMAGING — RF DG CHOLANGIOGRAM OPERATIVE
1 series · 4 of 4 positions shown · non-contrast
Comparison: None.

CLINICAL DATA: Right upper abdominal pain

EXAM:
INTRAOPERATIVE CHOLANGIOGRAM
TECHNIQUE: Cholangiographic images from the C-arm fluoroscopic device were
submitted for interpretation post-operatively. Please see the
procedural report for the amount of contrast and the fluoroscopy
time utilized.

[Series 1: run · 4 of 59 frames shown]
[frame 9/59]
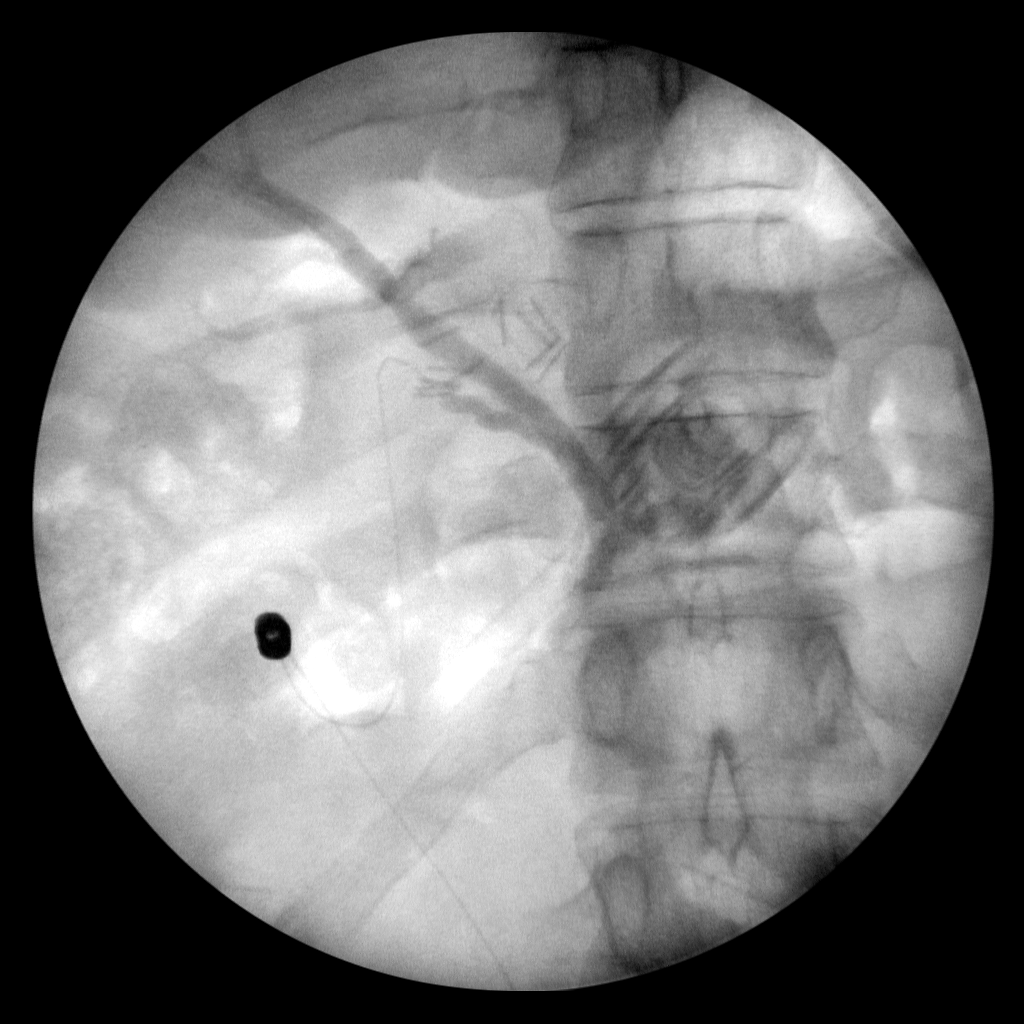
[frame 30/59]
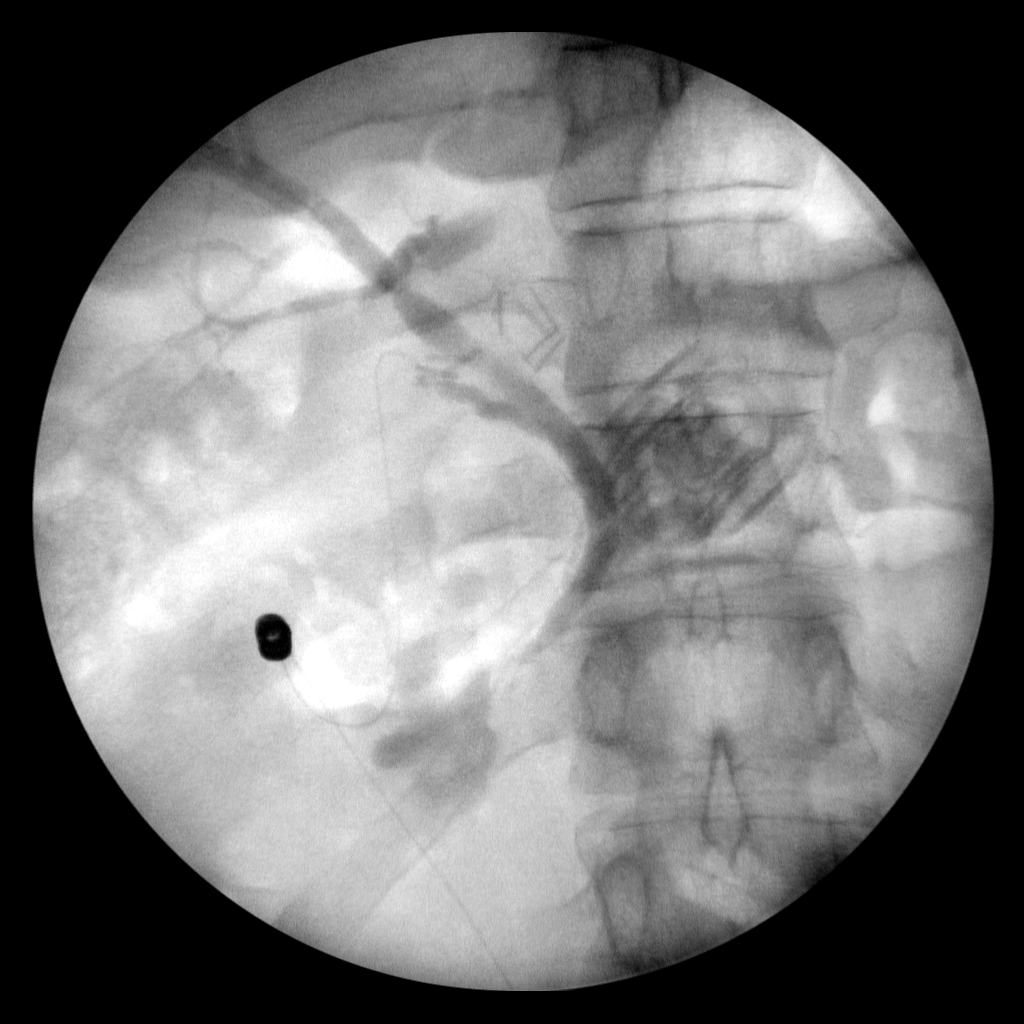
[frame 51/59]
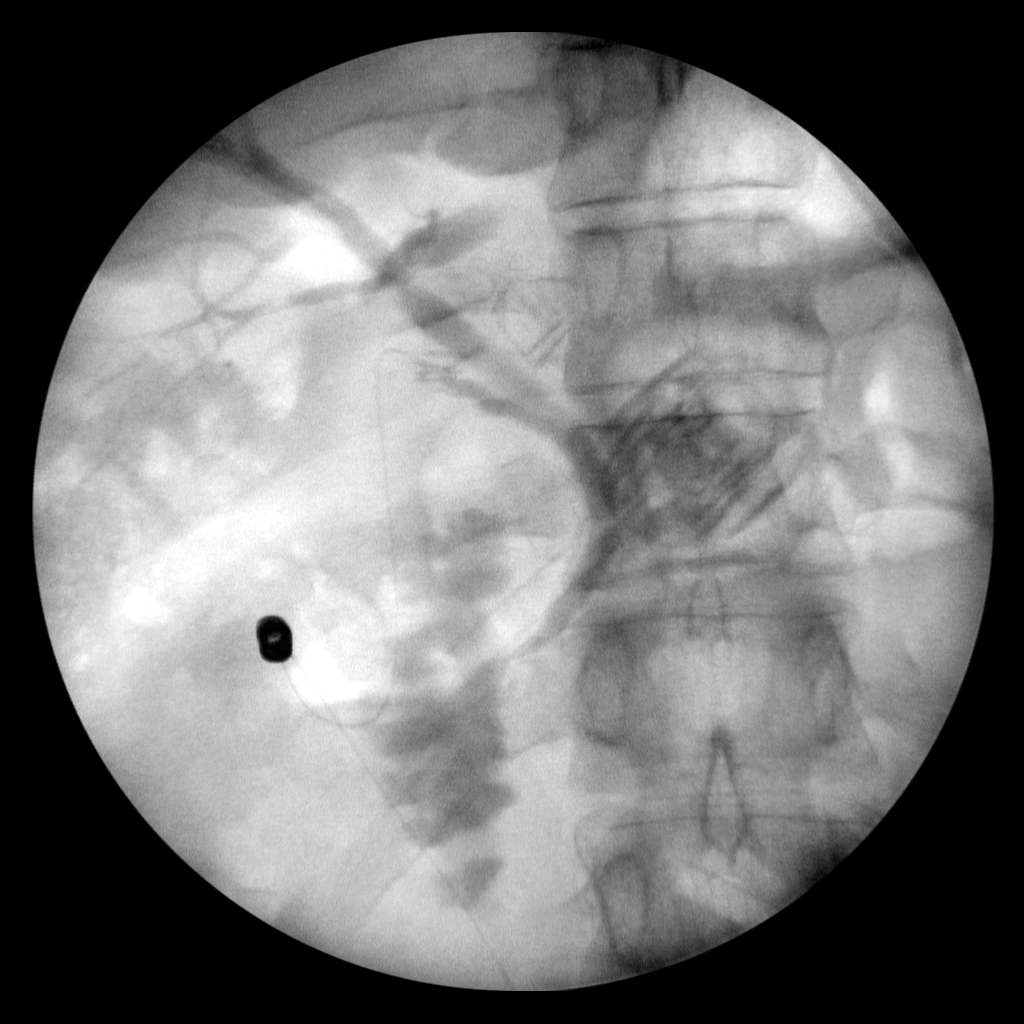
[frame 59/59]
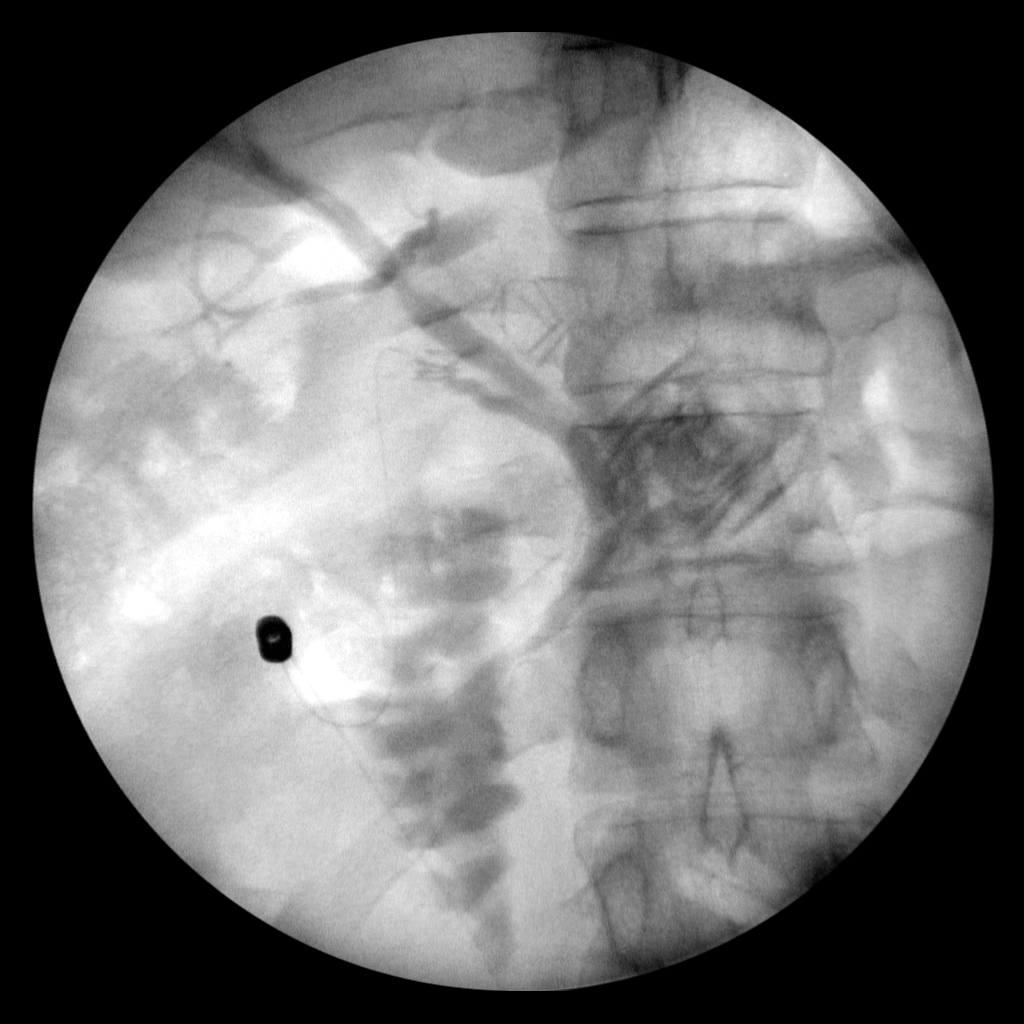

[4 of 4 positions shown; findings below may reference images not displayed]

FINDINGS: No persistent filling defects in the common duct. Intrahepatic ducts
are incompletely visualized, appearing decompressed centrally.
Contrast passes into the duodenum.

:
Negative for retained common duct stone.
